# Patient Record
Sex: Female | Born: 1990 | Race: Black or African American | Hispanic: No | Marital: Single | Smoking: Never smoker
Health system: Southern US, Community
[De-identification: ages and names within clinical notes are randomized; demographics above are authoritative.]

## PROBLEM LIST (undated history)

## (undated) DIAGNOSIS — R51 Headache: Secondary | ICD-10-CM

## (undated) DIAGNOSIS — N6310 Unspecified lump in the right breast, unspecified quadrant: Secondary | ICD-10-CM

## (undated) DIAGNOSIS — R519 Headache, unspecified: Secondary | ICD-10-CM

## (undated) HISTORY — DX: Headache: R51

## (undated) HISTORY — DX: Headache, unspecified: R51.9

## (undated) HISTORY — DX: Unspecified lump in the right breast, unspecified quadrant: N63.10

---

## 2008-01-08 HISTORY — PX: WISDOM TOOTH EXTRACTION: SHX21

## 2010-12-12 ENCOUNTER — Emergency Department (HOSPITAL_COMMUNITY): Payer: Worker's Compensation

## 2010-12-12 ENCOUNTER — Emergency Department (HOSPITAL_COMMUNITY)
Admission: EM | Admit: 2010-12-12 | Discharge: 2010-12-12 | Disposition: A | Discharge status: Home / Self Care | Payer: Worker's Compensation | Attending: Emergency Medicine | Admitting: Emergency Medicine

## 2010-12-12 DIAGNOSIS — S5010XA Contusion of unspecified forearm, initial encounter: Secondary | ICD-10-CM

## 2010-12-12 DIAGNOSIS — R51 Headache: Secondary | ICD-10-CM | POA: Insufficient documentation

## 2010-12-12 DIAGNOSIS — M7989 Other specified soft tissue disorders: Secondary | ICD-10-CM | POA: Insufficient documentation

## 2010-12-12 DIAGNOSIS — M79609 Pain in unspecified limb: Secondary | ICD-10-CM | POA: Insufficient documentation

## 2010-12-12 DIAGNOSIS — Y990 Civilian activity done for income or pay: Secondary | ICD-10-CM | POA: Insufficient documentation

## 2010-12-12 DIAGNOSIS — IMO0002 Reserved for concepts with insufficient information to code with codable children: Secondary | ICD-10-CM | POA: Insufficient documentation

## 2010-12-12 DIAGNOSIS — R1012 Left upper quadrant pain: Secondary | ICD-10-CM | POA: Insufficient documentation

## 2010-12-12 DIAGNOSIS — S0990XA Unspecified injury of head, initial encounter: Secondary | ICD-10-CM | POA: Insufficient documentation

## 2010-12-12 DIAGNOSIS — M542 Cervicalgia: Secondary | ICD-10-CM | POA: Insufficient documentation

## 2010-12-12 DIAGNOSIS — S139XXA Sprain of joints and ligaments of unspecified parts of neck, initial encounter: Secondary | ICD-10-CM | POA: Insufficient documentation

## 2010-12-12 DIAGNOSIS — S161XXA Strain of muscle, fascia and tendon at neck level, initial encounter: Secondary | ICD-10-CM

## 2010-12-12 LAB — CBC
HCT: 31 % — ABNORMAL LOW (ref 36.0–46.0)
Hemoglobin: 10.4 g/dL — ABNORMAL LOW (ref 12.0–15.0)
MCH: 21.7 pg — ABNORMAL LOW (ref 26.0–34.0)
MCHC: 33.5 g/dL (ref 30.0–36.0)
RBC: 4.8 MIL/uL (ref 3.87–5.11)

## 2010-12-12 LAB — BASIC METABOLIC PANEL
BUN: 6 mg/dL (ref 6–23)
CO2: 24 mEq/L (ref 19–32)
GFR calc non Af Amer: >90 mL/min (ref >90)
Glucose, Bld: 97 mg/dL (ref 70–99)
Potassium: 3.3 mEq/L — ABNORMAL LOW (ref 3.5–5.1)
Sodium: 140 mEq/L (ref 135–145)

## 2010-12-12 MED ORDER — CYCLOBENZAPRINE HCL 10 MG PO TABS: 10.0000 mg | ORAL_TABLET | Freq: Two times a day (BID) | ORAL | Status: AC | PRN

## 2010-12-12 MED ORDER — TETANUS-DIPHTH-ACELL PERTUSSIS 5-2.5-18.5 LF-MCG/0.5 IM SUSP
0.5000 mL | Freq: Once | INTRAMUSCULAR | Status: AC
  Administered 2010-12-12: 0.5 mL via INTRAMUSCULAR
  Filled 2010-12-12: qty 0.5

## 2010-12-12 MED ORDER — SODIUM CHLORIDE 0.9 % IV SOLN: INTRAVENOUS | Status: DC

## 2010-12-12 MED ORDER — IOHEXOL 300 MG/ML  SOLN
80.0000 mL | Freq: Once | INTRAMUSCULAR | Status: AC | PRN
  Administered 2010-12-12: 80 mL via INTRAVENOUS

## 2010-12-12 MED ORDER — IBUPROFEN 800 MG PO TABS: 800.0000 mg | ORAL_TABLET | Freq: Three times a day (TID) | ORAL | Status: AC

## 2010-12-12 MED ORDER — SODIUM CHLORIDE 0.9 % IV BOLUS (SEPSIS): 250.0000 mL | Freq: Once | INTRAVENOUS | Status: DC

## 2010-12-12 MED ORDER — OXYCODONE-ACETAMINOPHEN 5-325 MG PO TABS: 1.0000 | ORAL_TABLET | Freq: Four times a day (QID) | ORAL | Status: AC | PRN
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
## 2010-12-12 NOTE — ED Provider Notes (Signed)
Pt scans all negative for acute process or fracture. Discussed pain management with patient and gave her a referral to Ortho in case she continue to have symptoms and needs further management. Denied the need for crutches or sling. Denied any other new pains. Pt safe to D/C at this time  Dorthula Matas, [place] 12/12/10 2201

## 2010-12-12 NOTE — ED Provider Notes (Signed)
Medical screening examination/treatment/procedure(s) were performed by non-physician practitioner and as supervising physician I was immediately available for consultation/collaboration.   Joya Gaskins, MD 12/12/10 2322

## 2011-02-20 ENCOUNTER — Ambulatory Visit: Payer: BC Managed Care – PPO | Admitting: Family Medicine

## 2011-02-20 ENCOUNTER — Encounter: Payer: Self-pay | Admitting: Family Medicine

## 2011-02-20 VITALS — BP 120/79 | HR 89 | Temp 98.7°F | Resp 16 | Ht 62.5 in | Wt 113.4 lb

## 2011-02-20 DIAGNOSIS — J069 Acute upper respiratory infection, unspecified: Secondary | ICD-10-CM

## 2011-02-20 MED ORDER — AMOXICILLIN-POT CLAVULANATE 875-125 MG PO TABS: 1.0000 | ORAL_TABLET | Freq: Two times a day (BID) | ORAL | Status: AC

## 2011-02-20 NOTE — Progress Notes (Signed)
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 She comes in with a six-week history of cough and congestion. She also has a swollen gland on the right has been there since November. Final she had some questions about STD testing  The cough is intermittent and productive of green mucus. She's had no known fever or weight loss. She has some fullness in her ears appear her throat has been slightly sore to0.  A swollen gland in her right neck has fluctuated in size it is slightly tender. She's noticed no other swollen areas or swollen glands.  She's had no intercourse but has had some oral sex.  Objective: Throat is mildly erythematous, TMs show mild SOM, chest is slightly congested with rhonchi. Heart is regular no murmur  Neck shows a half centimeter right submandibular node which is nontender and nonfluctuant. Presently neck exam is normal  Assessment: Persistent URI symptoms with benign appearing lymph node. Patient does not seem to be a significant risk for STD at this time.  Plan: Augmentin 875 twice a day x10 days  Recheck left gland if it remains swollen for another month

## 2011-04-15 ENCOUNTER — Ambulatory Visit (INDEPENDENT_AMBULATORY_CARE_PROVIDER_SITE_OTHER): Payer: BC Managed Care – PPO | Admitting: General Surgery

## 2011-04-15 ENCOUNTER — Encounter (INDEPENDENT_AMBULATORY_CARE_PROVIDER_SITE_OTHER): Payer: Self-pay | Admitting: General Surgery

## 2011-04-15 VITALS — BP 118/76 | HR 68 | Temp 97.6°F | Resp 16 | Ht 62.0 in | Wt 118.1 lb

## 2011-04-15 DIAGNOSIS — N63 Unspecified lump in unspecified breast: Secondary | ICD-10-CM | POA: Insufficient documentation

## 2011-04-15 NOTE — Progress Notes (Signed)
Patient ID: Barbara Hendricks, female   DOB: 1990-06-03, 21 y.o.   MRN: [id number]  Chief Complaint  Patient presents with  . Mass    Right breast     HPI Barbara Hendricks is a 21 y.o. female.  Referred by Dr. Liz Beach HPI 20yof otherwise healthy who has had a right breast mass since at least 2011.  She underwent u/s then that I have report for which showed a superficial mass that measured 3.3x2.6x2.45mm in size. She was going to follow up in six months for another but did not.  To her, this area has gotten a little bigger but otherwise is asymptomatic.  She denies discharge.  Denies other masses. No FH of breast cancer. She comes in today to discuss treatment options.  It also has never had a history of infection.  Past Medical History  Diagnosis Date  . Generalized headaches   . Breast mass, right     Past Surgical History  Procedure Date  . Wisdom tooth extraction 01/2008    Family History  Problem Relation Age of Onset  . Cancer Maternal Grandmother     colon  . Cancer Paternal Grandmother     coloni    Social History History  Substance Use Topics  . Smoking status: Never Smoker   . Smokeless tobacco: Never Used  . Alcohol Use: Yes     1 - 2 per month    Allergies  Allergen Reactions  . Food Other (See Comments)    Strawberries=hives     Current Outpatient Prescriptions  Medication Sig Dispense Refill  . OVER THE COUNTER MEDICATION Take 1 tablet by mouth daily. Over the counter sinus and flu medication.  Patient took 1 tablet last night       . ranitidine (ZANTAC) 75 MG tablet Take 75 mg by mouth 2 (two) times daily.          Review of Systems Review of Systems  Constitutional: Negative for fever, chills and unexpected weight change.  HENT: Negative for hearing loss, congestion, sore throat, trouble swallowing and voice change.   Eyes: Negative for visual disturbance.  Respiratory: Negative for cough and wheezing.   Cardiovascular: Negative for chest  pain, palpitations and leg swelling.  Gastrointestinal: Negative for nausea, vomiting, abdominal pain, diarrhea, constipation, blood in stool, abdominal distention and anal bleeding.  Genitourinary: Negative for hematuria, vaginal bleeding and difficulty urinating.  Musculoskeletal: Negative for arthralgias.  Skin: Negative for rash and wound.  Neurological: Positive for headaches. Negative for seizures and syncope.  Hematological: Negative for adenopathy. Does not bruise/bleed easily.  Psychiatric/Behavioral: Negative for confusion.    Blood pressure 118/76, pulse 68, temperature 97.6 F (36.4 C), temperature source Temporal, resp. rate 16, height 5\' 2"  (1.575 m), weight 118 lb 2 oz (53.581 kg).  Physical Exam Physical Exam  Vitals reviewed. Constitutional: She appears well-developed and well-nourished.  Pulmonary/Chest: Right breast exhibits mass. Right breast exhibits no inverted nipple, no nipple discharge, no skin change and no tenderness. Left breast exhibits no inverted nipple, no mass, no nipple discharge, no skin change and no tenderness. Breasts are symmetrical.    Lymphadenopathy:    She has no axillary adenopathy.    Data Reviewed Prior u/s report from Dec 2011  Assessment    Right breast mass    Plan    This appears to be a breast mass and not skin lesion.  We discussed options including repeat ultrasound for follow up vs excision.  We both  agreed on obtaining another u/s and then following up to discuss the next step.        Barbara Hendricks 04/15/2011, 8:45 PM

## 2011-04-25 ENCOUNTER — Ambulatory Visit
Admission: RE | Admit: 2011-04-25 | Discharge: 2011-04-25 | Disposition: A | Discharge status: Home / Self Care | Payer: BC Managed Care – PPO | Source: Ambulatory Visit | Attending: General Surgery | Admitting: General Surgery

## 2011-04-25 DIAGNOSIS — N63 Unspecified lump in unspecified breast: Secondary | ICD-10-CM

## 2011-04-26 ENCOUNTER — Other Ambulatory Visit: Payer: Self-pay

## 2011-05-13 ENCOUNTER — Encounter (INDEPENDENT_AMBULATORY_CARE_PROVIDER_SITE_OTHER): Payer: BC Managed Care – PPO | Admitting: General Surgery

## 2011-05-13 ENCOUNTER — Ambulatory Visit: Payer: BC Managed Care – PPO | Admitting: Internal Medicine

## 2011-05-13 VITALS — BP 112/74 | HR 94 | Temp 99.1°F | Resp 16 | Ht 62.5 in | Wt 115.0 lb

## 2011-05-13 DIAGNOSIS — J020 Streptococcal pharyngitis: Secondary | ICD-10-CM

## 2011-05-13 DIAGNOSIS — J309 Allergic rhinitis, unspecified: Secondary | ICD-10-CM

## 2011-05-13 DIAGNOSIS — J039 Acute tonsillitis, unspecified: Secondary | ICD-10-CM

## 2011-05-13 DIAGNOSIS — I889 Nonspecific lymphadenitis, unspecified: Secondary | ICD-10-CM

## 2011-05-13 DIAGNOSIS — R599 Enlarged lymph nodes, unspecified: Secondary | ICD-10-CM

## 2011-05-13 DIAGNOSIS — J029 Acute pharyngitis, unspecified: Secondary | ICD-10-CM

## 2011-05-13 MED ORDER — AMOXICILLIN 500 MG PO CAPS: 1000.0000 mg | ORAL_CAPSULE | Freq: Two times a day (BID) | ORAL | Status: AC

## 2011-05-13 MED ORDER — FLUTICASONE PROPIONATE 50 MCG/ACT NA SUSP: 2.0000 | Freq: Every day | NASAL | Status: AC

## 2011-05-13 NOTE — Progress Notes (Signed)
  Subjective:    Patient ID: Barbara Hendricks, female    DOB: 06/27/1990, 21 y.o.   MRN: [id number]  HPIComplaining of sore throat for 3 days She is also worried about a swollen right anterior cervical node which has been present since November 2011/see old chart No fever or chills No cough No runny nose    Review of Systems     Objective:   Physical Exam Nares clear Throat red with enlarged tonsils and slight exudate Right a.c. Node is 2+ and tender high in the a.c. Chain No submandibular node Teeth are clear       Results for orders placed in visit on 05/13/11  POCT RAPID STREP A (OFFICE)      Component Value Range   Rapid Strep A Screen Positive (*) Negative     Assessment & Plan:  Problem #1 acute tonsillitis secondary to strep Amoxicillin 500 2 tabs twice a day for 10 days Recheck lymph nodes in 3 weeks

## 2011-05-21 ENCOUNTER — Encounter (INDEPENDENT_AMBULATORY_CARE_PROVIDER_SITE_OTHER): Payer: Self-pay

## 2011-10-15 ENCOUNTER — Encounter (INDEPENDENT_AMBULATORY_CARE_PROVIDER_SITE_OTHER): Payer: Self-pay | Admitting: General Surgery

## 2011-12-20 ENCOUNTER — Encounter: Payer: Self-pay | Admitting: Family Medicine

## 2011-12-20 ENCOUNTER — Ambulatory Visit: Payer: BC Managed Care – PPO

## 2011-12-20 ENCOUNTER — Ambulatory Visit: Admission: RE | Admit: 2011-12-20 | Payer: Self-pay | Source: Ambulatory Visit

## 2011-12-20 ENCOUNTER — Ambulatory Visit (INDEPENDENT_AMBULATORY_CARE_PROVIDER_SITE_OTHER): Payer: BC Managed Care – PPO | Admitting: Family Medicine

## 2011-12-20 VITALS — BP 120/66 | HR 78 | Temp 99.4°F | Resp 16 | Ht 62.0 in | Wt 115.0 lb

## 2011-12-20 DIAGNOSIS — R5383 Other fatigue: Secondary | ICD-10-CM

## 2011-12-20 DIAGNOSIS — R5381 Other malaise: Secondary | ICD-10-CM

## 2011-12-20 DIAGNOSIS — M79671 Pain in right foot: Secondary | ICD-10-CM

## 2011-12-20 DIAGNOSIS — M791 Myalgia, unspecified site: Secondary | ICD-10-CM

## 2011-12-20 DIAGNOSIS — M79609 Pain in unspecified limb: Secondary | ICD-10-CM

## 2011-12-20 DIAGNOSIS — IMO0001 Reserved for inherently not codable concepts without codable children: Secondary | ICD-10-CM

## 2011-12-20 LAB — CBC WITH DIFFERENTIAL/PLATELET
Eosinophils Absolute: 0 10*3/uL (ref 0.0–0.7)
Eosinophils Relative: 1 % (ref 0–5)
HCT: 32.6 % — ABNORMAL LOW (ref 36.0–46.0)
Lymphocytes Relative: 48 % — ABNORMAL HIGH (ref 12–46)
Lymphs Abs: 2.8 10*3/uL (ref 0.7–4.0)
MCH: 20.8 pg — ABNORMAL LOW (ref 26.0–34.0)
MCV: 64.4 fL — ABNORMAL LOW (ref 78.0–100.0)
Monocytes Absolute: 0.5 10*3/uL (ref 0.1–1.0)
Monocytes Relative: 8 % (ref 3–12)
Platelets: 325 10*3/uL (ref 150–400)
RBC: 5.06 MIL/uL (ref 3.87–5.11)
WBC: 5.8 10*3/uL (ref 4.0–10.5)

## 2011-12-20 LAB — COMPREHENSIVE METABOLIC PANEL
AST: 19 U/L (ref 0–37)
Albumin: 4.4 g/dL (ref 3.5–5.2)
BUN: 8 mg/dL (ref 6–23)
CO2: 23 mEq/L (ref 19–32)
Calcium: 9.3 mg/dL (ref 8.4–10.5)
Chloride: 105 mEq/L (ref 96–112)
Creat: 0.56 mg/dL (ref 0.50–1.10)
Glucose, Bld: 87 mg/dL (ref 70–99)
Potassium: 3.9 mEq/L (ref 3.5–5.3)

## 2011-12-20 LAB — CK: Total CK: 79 U/L (ref 7–177)

## 2011-12-20 MED ORDER — NAPROXEN 500 MG PO TABS: 500.0000 mg | ORAL_TABLET | Freq: Two times a day (BID) | ORAL | Status: DC

## 2011-12-20 MED ORDER — METHOCARBAMOL 500 MG PO TABS: 500.0000 mg | ORAL_TABLET | Freq: Four times a day (QID) | ORAL | Status: DC | PRN

## 2011-12-20 NOTE — Progress Notes (Signed)
  Subjective:    Patient ID: Barbara Hendricks, female    DOB: 1990-04-12, 21 y.o.   MRN: [id number] Chief Complaint  Patient presents with  . Follow-up    right foot hurts x 3wks and back hurts x 1 wk    HPI 3 or 4 yrs ago a bed fell on her right foot at [place] w/ severe bruising Sev d ago had sharp pain lasting for a few sec and now get a few twinges w/ certain movements    Review of Systems    BP 120/66  Pulse 78  Temp(Src) 99.4 F (37.4 C) (Oral)  Resp 16  Ht 5\' 2"  (1.575 m)  Wt 115 lb (52.164 kg)  BMI 21.03 kg/m2  SpO2 100%  LMP 11/29/2011 Objective:   Physical Exam        [facility] reading (PRIMARY) by  Dr. Clelia Croft.  No bony abnormality seen.   Assessment & Plan:  Foot pain, right - Plan: naproxen (NAPROSYN) 500 MG tablet, DG Foot Complete Right, CANCELED: DG Foot Complete Right  Myalgia - Plan: Vitamin D 25 hydroxy, CK, Sedimentation rate, methocarbamol (ROBAXIN) 500 MG tablet  Fatigue - Plan: CBC with Differential, TSH, Comprehensive metabolic panel  Meds ordered this encounter  Medications  . methocarbamol (ROBAXIN) 500 MG tablet    Sig: Take 1 tablet (500 mg total) by mouth 4 (four) times daily as needed (muscle spasm).    Dispense:  60 tablet    Refill:  0  . naproxen (NAPROSYN) 500 MG tablet    Sig: Take 1 tablet (500 mg total) by mouth 2 (two) times daily with a meal.    Dispense:  30 tablet    Refill:  2

## 2011-12-21 LAB — SEDIMENTATION RATE: Sed Rate: 18 mm/hr (ref 0–22)

## 2011-12-23 ENCOUNTER — Ambulatory Visit: Payer: BC Managed Care – PPO

## 2011-12-26 ENCOUNTER — Telehealth: Payer: Self-pay | Admitting: Radiology

## 2011-12-26 NOTE — Telephone Encounter (Signed)
Dr. Clelia Croft, FYI: [facility] of normal labs.

## 2011-12-26 NOTE — Telephone Encounter (Signed)
Pt called to get results, results are final but do need review. Please review and we will contact pt. I also explained to pt that we would give her call back once PA has reviewed her labs. She understood.

## 2011-12-26 NOTE — Telephone Encounter (Signed)
Labs normal.

## 2012-02-22 ENCOUNTER — Other Ambulatory Visit: Payer: Self-pay

## 2012-03-11 ENCOUNTER — Other Ambulatory Visit (INDEPENDENT_AMBULATORY_CARE_PROVIDER_SITE_OTHER): Payer: Self-pay | Admitting: General Surgery

## 2012-03-11 DIAGNOSIS — N6310 Unspecified lump in the right breast, unspecified quadrant: Secondary | ICD-10-CM

## 2012-03-16 ENCOUNTER — Ambulatory Visit
Admission: RE | Admit: 2012-03-16 | Discharge: 2012-03-16 | Disposition: A | Discharge status: Home / Self Care | Payer: BC Managed Care – PPO | Source: Ambulatory Visit | Attending: General Surgery | Admitting: General Surgery
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
## 2012-11-12 ENCOUNTER — Other Ambulatory Visit: Payer: Self-pay

## 2013-01-12 ENCOUNTER — Ambulatory Visit: Payer: BC Managed Care – PPO

## 2013-02-12 ENCOUNTER — Ambulatory Visit (INDEPENDENT_AMBULATORY_CARE_PROVIDER_SITE_OTHER): Payer: BC Managed Care – PPO | Admitting: Family Medicine

## 2013-02-12 ENCOUNTER — Encounter: Payer: Self-pay | Admitting: Family Medicine

## 2013-02-12 VITALS — BP 120/70 | HR 85 | Temp 98.3°F | Resp 16 | Ht 62.0 in | Wt 122.0 lb

## 2013-02-12 DIAGNOSIS — K625 Hemorrhage of anus and rectum: Secondary | ICD-10-CM

## 2013-02-12 DIAGNOSIS — R109 Unspecified abdominal pain: Secondary | ICD-10-CM

## 2013-02-12 LAB — CBC WITH DIFFERENTIAL/PLATELET
BASOS PCT: 1 % (ref 0–1)
Basophils Absolute: 0 10*3/uL (ref 0.0–0.1)
EOS ABS: 0 10*3/uL (ref 0.0–0.7)
Eosinophils Relative: 0 % (ref 0–5)
HEMATOCRIT: 33.3 % — AB (ref 36.0–46.0)
HEMOGLOBIN: 10.9 g/dL — AB (ref 12.0–15.0)
LYMPHS ABS: 3.2 10*3/uL (ref 0.7–4.0)
Lymphocytes Relative: 44 % (ref 12–46)
MCH: 21.3 pg — AB (ref 26.0–34.0)
MCHC: 32.7 g/dL (ref 30.0–36.0)
MCV: 65 fL — ABNORMAL LOW (ref 78.0–100.0)
MONO ABS: 0.6 10*3/uL (ref 0.1–1.0)
MONOS PCT: 8 % (ref 3–12)
NEUTROS ABS: 3.5 10*3/uL (ref 1.7–7.7)
Neutrophils Relative %: 47 % (ref 43–77)
Platelets: 331 10*3/uL (ref 150–400)
RBC: 5.12 MIL/uL — ABNORMAL HIGH (ref 3.87–5.11)
RDW: 20.1 % — ABNORMAL HIGH (ref 11.5–15.5)
WBC: 7.4 10*3/uL (ref 4.0–10.5)

## 2013-02-12 MED ORDER — OMEPRAZOLE 20 MG PO CPDR: 20.0000 mg | DELAYED_RELEASE_CAPSULE | Freq: Every day | ORAL | Status: DC

## 2013-02-12 NOTE — Progress Notes (Signed)
S:   This 23 y.o. AA female is here for evaluation of chronic (> 6 months) abdominal bloating and discomfort. She tried Acidophilus for a few weeks and loose stools seem to worsen. Onset of swollen hemorrhoids and blood in stools started a few weeks ago. This continued even after she stopped taking the Acidophilus. She does not consume a lot of dairy. Consumption of salad products seems to aggravate symptoms. Fiber intake less than adequate. Pt has not used any over-the-counter medications for these symptoms.  Family hx significant for Colon cancer in both grandmothers. Her father has hx of hemorrhoids requiring surgery.  Patient Active Problem List   Diagnosis Date Noted  . Breast mass in female 04/15/2011   Prior to Admission medications   Medication Sig Start Date End Date Taking? Authorizing Provider  cetirizine (ZYRTEC) 10 MG tablet Take 10 mg by mouth daily.    Historical Provider, MD  fluticasone (FLONASE) 50 MCG/ACT nasal spray Place 2 sprays into the nose daily. 05/13/11 05/12/12  Tonye Pearsonobert P Doolittle, MD  omeprazole (PRILOSEC) 20 MG capsule Take 1 capsule (20 mg total) by mouth daily. 02/12/13   Maurice MarchBarbara B Shakeela Rabadan, MD   PMHx, Surg Hx, Soc and Fam Hx reviewed.  ROS; Asper HPI; negative for anorexia, fever/chills, abnormal weight loss, fatigue, hair or skin changes, changes in menses or abnormal bleeding, pelvic pain, HA, dizziness, weakness or syncope.  O: Filed Vitals:   02/12/13 1337  BP: 120/70  Pulse: 85  Temp: 98.3 F (36.8 C)  Resp: 16   GEN: in NAD: WN,WD. HENT: [place]/AT; EOMI w/ clear conj/sclerae. EACs/TMs, nose and oroph benign NECK: Supple w/o LAN or TMG.  COR: RRR. Normal S1 and S2. No mg/r. SKIN: W&D; Intact w/o diaphoresis, erythema or jaundice. ABD: Normal appearance; soft w/o guarding or distention. Mildly tender in epig and lower quadrants. No rebound. No HSM or masses. RECTAL: Ext hemorrhoids (mild) and small anal fissure. No active bleeding. NEURO: A&O x 3; Cns  intact. Nonfocal.  A/P: Rectal bleeding - Plan: CBC with Differential, COMPLETE METABOLIC PANEL WITH GFR, H. pylori antibody, IgG  Abdominal pain, unspecified site - Trial PPI (Omeprazole 20 mg daily) pending labs.  Plan: CBC with Differential, COMPLETE METABOLIC PANEL WITH GFR, H. pylori antibody, IgG   Meds ordered this encounter  Medications  . omeprazole (PRILOSEC) 20 MG capsule    Sig: Take 1 capsule (20 mg total) by mouth daily.    Dispense:  30 capsule    Refill:  5

## 2013-02-12 NOTE — Patient Instructions (Addendum)
Helicobacter Pylori Antibodies Test This is a blood test which looks for a germ called Helicobacter pylori. This can also be diagnosed by a breath test or a microscopic examination of a portion (biopsy) of the small bowel. H. pylori is a germ that is found in the cells that line the stomach. It is a risk factor for stomach and small bowel ulcers, long standing inflammation of the lining of the stomach, or even ulcers that may occur in the esophagus (the canal that runs from the mouth to the stomach). This bacterium is also a factor in stomach cancer. The amount of the bacteria is found in about 10% of healthy persons younger than 25 years of age and the amount of the bacteria increases with age. Most persons with these bacteria have no symptoms; however, it is thought that when these bacteria cause ulcers, antibiotic medications can be used to help eliminate or reduce the problem.  PREPARATION FOR TEST No preparation or fasting is necessary. NORMAL FINDINGS Negative (H-pylori bacteria not present) Ranges for normal findingsmay vary among different laboratories and hospitals. You should always check with your doctor after having lab work or other tests done to discuss the meaning of your test results and whether or not your values are considered within normal limits. MEANING OF TEST  Your caregiver will go over the test results with you and discuss the importance and meaning of your results, as well as treatment options and the need for additional tests if necessary. It is your responsibility to obtain your test results. Ask the lab or department performing the test when and how you will get your results. OBTAINING THE TEST RESULTS It is your responsibility to obtain your test results. Ask the lab or department performing the test when and how you will get your results. Document Released: 01/18/2004 Document Revised: 03/18/2011 Document Reviewed: 12/05/2007 [facility] [place] Patient Information 2014 [place],  [place].    Bloating Bloating is the feeling of fullness in your belly. You may feel as though your pants are too tight. Often the cause of bloating is overeating, retaining fluids, or having gas in your bowel. It is also caused by swallowing air and eating foods that cause gas. Irritable bowel syndrome is one of the most common causes of bloating. Constipation is also a common cause. Sometimes more serious problems can cause bloating. SYMPTOMS  Usually there is a feeling of fullness, as though your abdomen is bulged out. There may be mild discomfort.  DIAGNOSIS  Usually no particular testing is necessary for most bloating. If the condition persists and seems to become worse, your caregiver may do additional testing.  TREATMENT   There is no direct treatment for bloating.  Do not put gas into the bowel. Avoid chewing gum and sucking on candy. These tend to make you swallow air. Swallowing air can also be a nervous habit. Try to avoid this.  Avoiding high residue diets will help. Eat foods with soluble fibers (examples include root vegetables, apples, or barley) and substitute dairy products with soy and rice products. This helps irritable bowel syndrome.  If constipation is the cause, then a high residue diet with more fiber will help.  Avoid carbonated beverages.  Over-the-counter preparations are available that help reduce gas. Your pharmacist can help you with this. SEEK MEDICAL CARE IF:   Bloating continues and seems to be getting worse.  You notice a weight gain.  You have a weight loss but the bloating is getting worse.  You have changes  in your bowel habits or develop nausea or vomiting. SEEK IMMEDIATE MEDICAL CARE IF:   You develop shortness of breath or swelling in your legs.  You have an increase in abdominal pain or develop chest pain. Document Released: 10/24/2005 Document Revised: 03/18/2011 Document Reviewed: 12/12/2006 [facility] Patient Information 2014 [place],  [place].  I am going to prescribe some medication for you to take until we have the lab results back.  Take the medication every morning on an empty stomach. Try to eat as healthy as possible.  I will see you again in 6 weeks or sooner if your symptom

## 2013-02-13 LAB — COMPLETE METABOLIC PANEL WITH GFR
ALBUMIN: 4.4 g/dL (ref 3.5–5.2)
ALK PHOS: 61 U/L (ref 39–117)
ALT: 9 U/L (ref 0–35)
AST: 18 U/L (ref 0–37)
BUN: 7 mg/dL (ref 6–23)
CALCIUM: 9.7 mg/dL (ref 8.4–10.5)
CO2: 22 meq/L (ref 19–32)
Chloride: 103 mEq/L (ref 96–112)
Creat: 0.65 mg/dL (ref 0.50–1.10)
GLUCOSE: 86 mg/dL (ref 70–99)
POTASSIUM: 3.7 meq/L (ref 3.5–5.3)
Sodium: 139 mEq/L (ref 135–145)
Total Bilirubin: 0.5 mg/dL (ref 0.2–1.2)
Total Protein: 8 g/dL (ref 6.0–8.3)

## 2013-02-14 DIAGNOSIS — K649 Unspecified hemorrhoids: Secondary | ICD-10-CM | POA: Insufficient documentation

## 2013-02-15 LAB — H. PYLORI ANTIBODY, IGG: H PYLORI IGG: 0.89 {ISR}

## 2013-02-16 ENCOUNTER — Other Ambulatory Visit: Payer: Self-pay | Admitting: Family Medicine

## 2013-02-16 ENCOUNTER — Encounter: Payer: Self-pay | Admitting: Family Medicine

## 2013-02-16 MED ORDER — CENTRUM PO CHEW: 1.0000 | CHEWABLE_TABLET | Freq: Every day | ORAL | Status: AC

## 2013-02-18 NOTE — Telephone Encounter (Signed)
Spoke with patient gave patient message and advised her to follow back up in 3 months patient understands and will set up an appt

## 2013-03-09 ENCOUNTER — Telehealth: Payer: Self-pay

## 2013-03-09 NOTE — Telephone Encounter (Signed)
Pt states that the medication that was given to her was "making the problem worse" so she stopped taking it. She would like to know what to do from here.  Please call  [phone]

## 2013-03-11 NOTE — Telephone Encounter (Signed)
Pt needs to rtc for further evaluation. Lm for rtn call or rtc.

## 2013-03-18 ENCOUNTER — Encounter: Payer: Self-pay | Admitting: Family Medicine

## 2013-03-18 ENCOUNTER — Ambulatory Visit (INDEPENDENT_AMBULATORY_CARE_PROVIDER_SITE_OTHER): Payer: BC Managed Care – PPO | Admitting: Family Medicine

## 2013-03-18 VITALS — BP 122/70 | HR 72 | Temp 98.5°F | Resp 16 | Ht 62.5 in | Wt 122.0 lb

## 2013-03-18 DIAGNOSIS — K625 Hemorrhage of anus and rectum: Secondary | ICD-10-CM

## 2013-03-18 DIAGNOSIS — D649 Anemia, unspecified: Secondary | ICD-10-CM

## 2013-03-18 DIAGNOSIS — K649 Unspecified hemorrhoids: Secondary | ICD-10-CM

## 2013-03-18 MED ORDER — HYDROCORTISONE ACETATE 25 MG RE SUPP: 25.0000 mg | Freq: Two times a day (BID) | RECTAL | Status: AC

## 2013-03-18 NOTE — Patient Instructions (Addendum)
I have referred you to a Gastroenterologist (GI specialist) for further evaluation of hemorrhoids and change in stools as well. You can use the prescribed medication for hemorrhoids until you are the specialist.   Hemorrhoids Hemorrhoids are swollen veins around the rectum or anus. There are two types of hemorrhoids:   Internal hemorrhoids. These occur in the veins just inside the rectum. They may poke through to the outside and become irritated and painful.  External hemorrhoids. These occur in the veins outside the anus and can be felt as a painful swelling or hard lump near the anus. CAUSES  Pregnancy.   Obesity.   Constipation or diarrhea.   Straining to have a bowel movement.   Sitting for long periods on the toilet.  Heavy lifting or other activity that caused you to strain.  Anal intercourse. SYMPTOMS   Pain.   Anal itching or irritation.   Rectal bleeding.   Fecal leakage.   Anal swelling.   One or more lumps around the anus.  DIAGNOSIS  Your caregiver may be able to diagnose hemorrhoids by visual examination. Other examinations or tests that may be performed include:   Examination of the rectal area with a gloved hand (digital rectal exam).   Examination of anal canal using a small tube (scope).   A blood test if you have lost a significant amount of blood.  A test to look inside the colon (sigmoidoscopy or colonoscopy). TREATMENT Most hemorrhoids can be treated at home. However, if symptoms do not seem to be getting better or if you have a lot of rectal bleeding, your caregiver may perform a procedure to help make the hemorrhoids get smaller or remove them completely. Possible treatments include:   Placing a rubber band at the base of the hemorrhoid to cut off the circulation (rubber band ligation).   Injecting a chemical to shrink the hemorrhoid (sclerotherapy).   Using a tool to burn the hemorrhoid (infrared light therapy).    Surgically removing the hemorrhoid (hemorrhoidectomy).   Stapling the hemorrhoid to block blood flow to the tissue (hemorrhoid stapling).  HOME CARE INSTRUCTIONS   Eat foods with fiber, such as whole grains, beans, nuts, fruits, and vegetables. Ask your doctor about taking products with added fiber in them (fibersupplements).  Increase fluid intake. Drink enough water and fluids to keep your urine clear or pale yellow.   Exercise regularly.   Go to the bathroom when you have the urge to have a bowel movement. Do not wait.   Avoid straining to have bowel movements.   Keep the anal area dry and clean. Use wet toilet paper or moist towelettes after a bowel movement.   Medicated creams and suppositories may be used or applied as directed.   Only take over-the-counter or prescription medicines as directed by your caregiver.   Take warm sitz baths for 15 20 minutes, 3 4 times a day to ease pain and discomfort.   Place ice packs on the hemorrhoids if they are tender and swollen. Using ice packs between sitz baths may be helpful.   Put ice in a plastic bag.   Place a towel between your skin and the bag.   Leave the ice on for 15 20 minutes, 3 4 times a day.   Do not use a donut-shaped pillow or sit on the toilet for long periods. This increases blood pooling and pain.  SEEK MEDICAL CARE IF:  You have increasing pain and swelling that is not controlled by treatment  or medicine.  You have uncontrolled bleeding.  You have difficulty or you are unable to have a bowel movement.  You have pain or inflammation outside the area of the hemorrhoids. MAKE SURE YOU:  Understand these instructions.  Will watch your condition.  Will get help right away if you are not doing well or get worse. Document Released: 12/22/1999 Document Revised: 12/11/2011 Document Reviewed: 10/29/2011 [facility] Patient Information 2014 [place], [place].

## 2013-03-21 ENCOUNTER — Encounter: Payer: Self-pay | Admitting: Family Medicine

## 2013-03-21 DIAGNOSIS — D649 Anemia, unspecified: Secondary | ICD-10-CM | POA: Insufficient documentation

## 2013-03-21 NOTE — Progress Notes (Signed)
S:  This 23 y.o. AA female returns for follow-up re: hemorrhoids and intermittent blood in stools. Her appetite is good and weight is stable. She has mild irregularity of bowel movements. Nutritional modification has resulted in some improvement of symptoms. She denies n/v/d or abdominal or pelvic pain. PPI was prescribed at last visit which helped for a few weeks. Pt then experienced increased stool frequency which irritated hemorrhoids; pt discontinued Omeprazole and stool consistency returned to normal.  Pt has a chronic anemia dating back 2 years. She is asymptomatic, denying fatigue, CP or palpitations, edema, SOB or DOE, muscle cramps or intolerance of cold. She has not been taking prescribed MVI daily.  Patient Active Problem List   Diagnosis Date Noted  . Hemorrhoids 02/14/2013  . Breast mass in female 04/15/2011   Prior to Admission medications   Medication Sig Start Date End Date Taking? Authorizing Provider  multivitamin-iron-minerals-folic acid (CENTRUM) chewable tablet Chew 1 tablet by mouth daily. 02/16/13  Yes Maurice MarchBarbara B Ivann Trimarco, MD  fluticasone [facility](FLONASE) 50 MCG/ACT nasal spray Place 2 sprays into the nose daily. 05/13/11 05/12/12  Tonye Pearsonobert P Doolittle, MD          PMHx, Surg Hx, Soc and Fam Hx reviewed.  ROS: AS per HPI.  O: Filed Vitals:   03/18/13 1244  BP: 122/70  Pulse: 72  Temp: 98.5 F (36.9 C)  Resp: 16   GEN: in NAD: WN,WD. HENT: [place]/AT; EOMI w/ clear conj/sclerae. Mild conjunctival pallor. Otherwise unremarkable. NECK: Supple. No LAN. COR: RRR. No peripheral edema. LUNGS: Unlabored resp. SKIN: W&D; intact w/o  Diaphoresis, erythema, rashes or jaundice. MS: MAEs; no deformity, joint effusions or muscle tenderness. NEURO: A&O x 3; CNs intact. Nonfocal.  A/P: Hemorrhoids - Plan: Ambulatory referral to Gastroenterology. Cotinue dietary modifications to normalize stools.  Rectal bleeding - Plan: Ambulatory referral to Gastroenterology  Anemia - Continue MVI and  healthy nutrition.  Meds ordered this encounter  Medications  . hydrocortisone (ANUSOL-HC) 25 MG suppository    Sig: Place 1 suppository (25 mg total) rectally 2 (two) times daily. Use as needed for hemorrhoid inflammation.    Dispense:  12 suppository    Refill:  0

## 2013-10-27 IMAGING — CR DG FOOT COMPLETE 3+V*R*
3 series · 3 of 3 positions shown · non-contrast
Comparison: None.

CLINICAL DATA: Injury

RIGHT FOOT COMPLETE - 3+ VIEW

[AP]
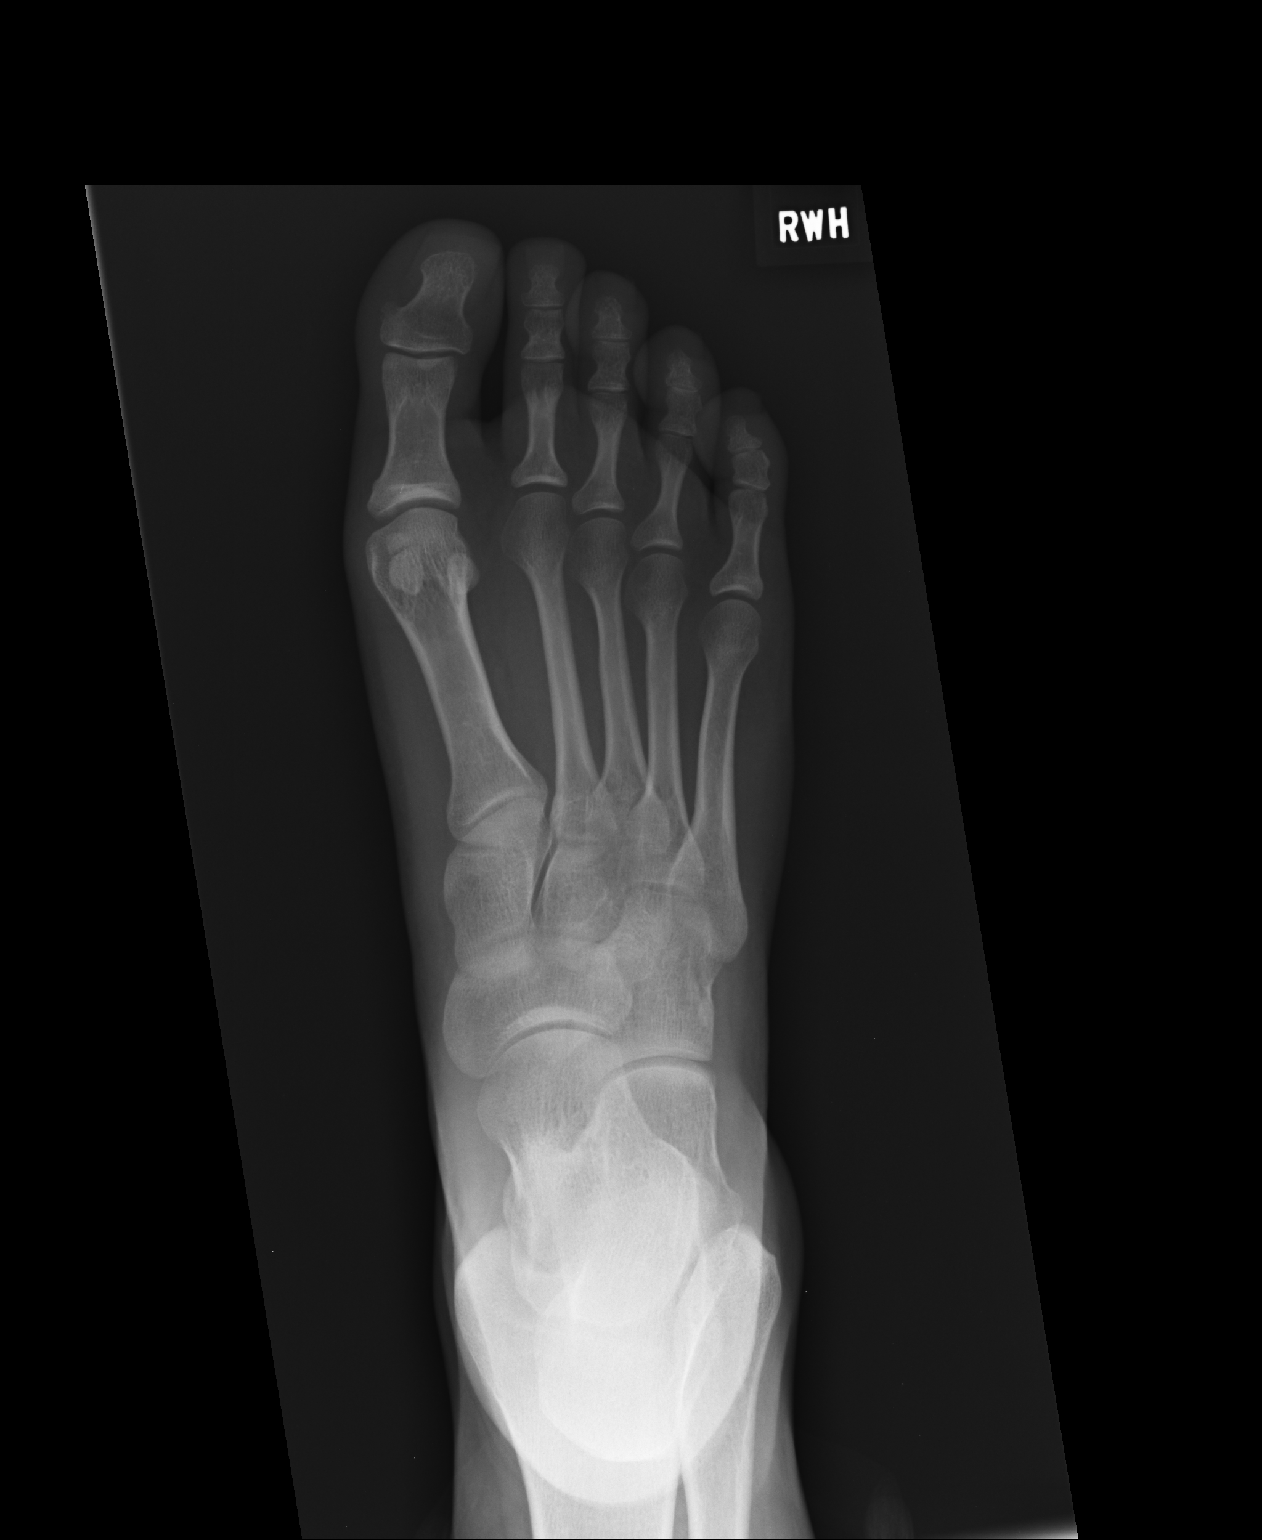

[lateral]
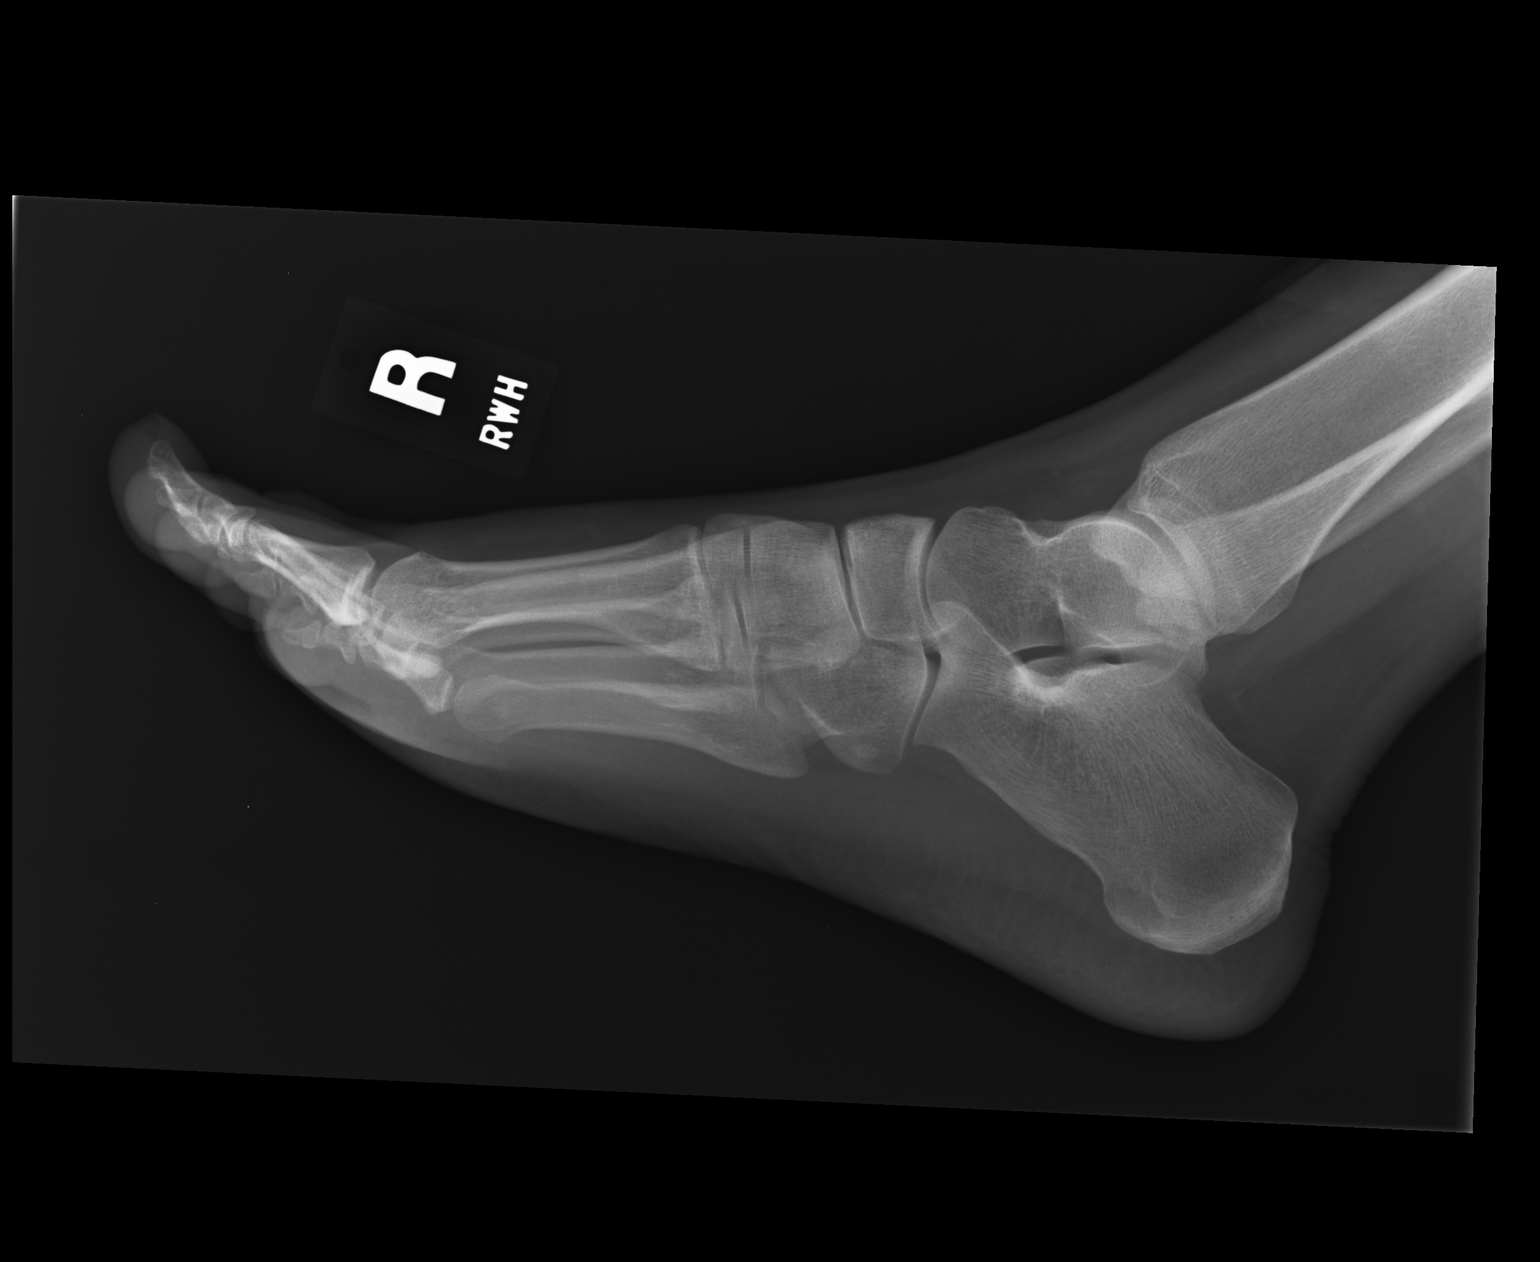

[oblique]
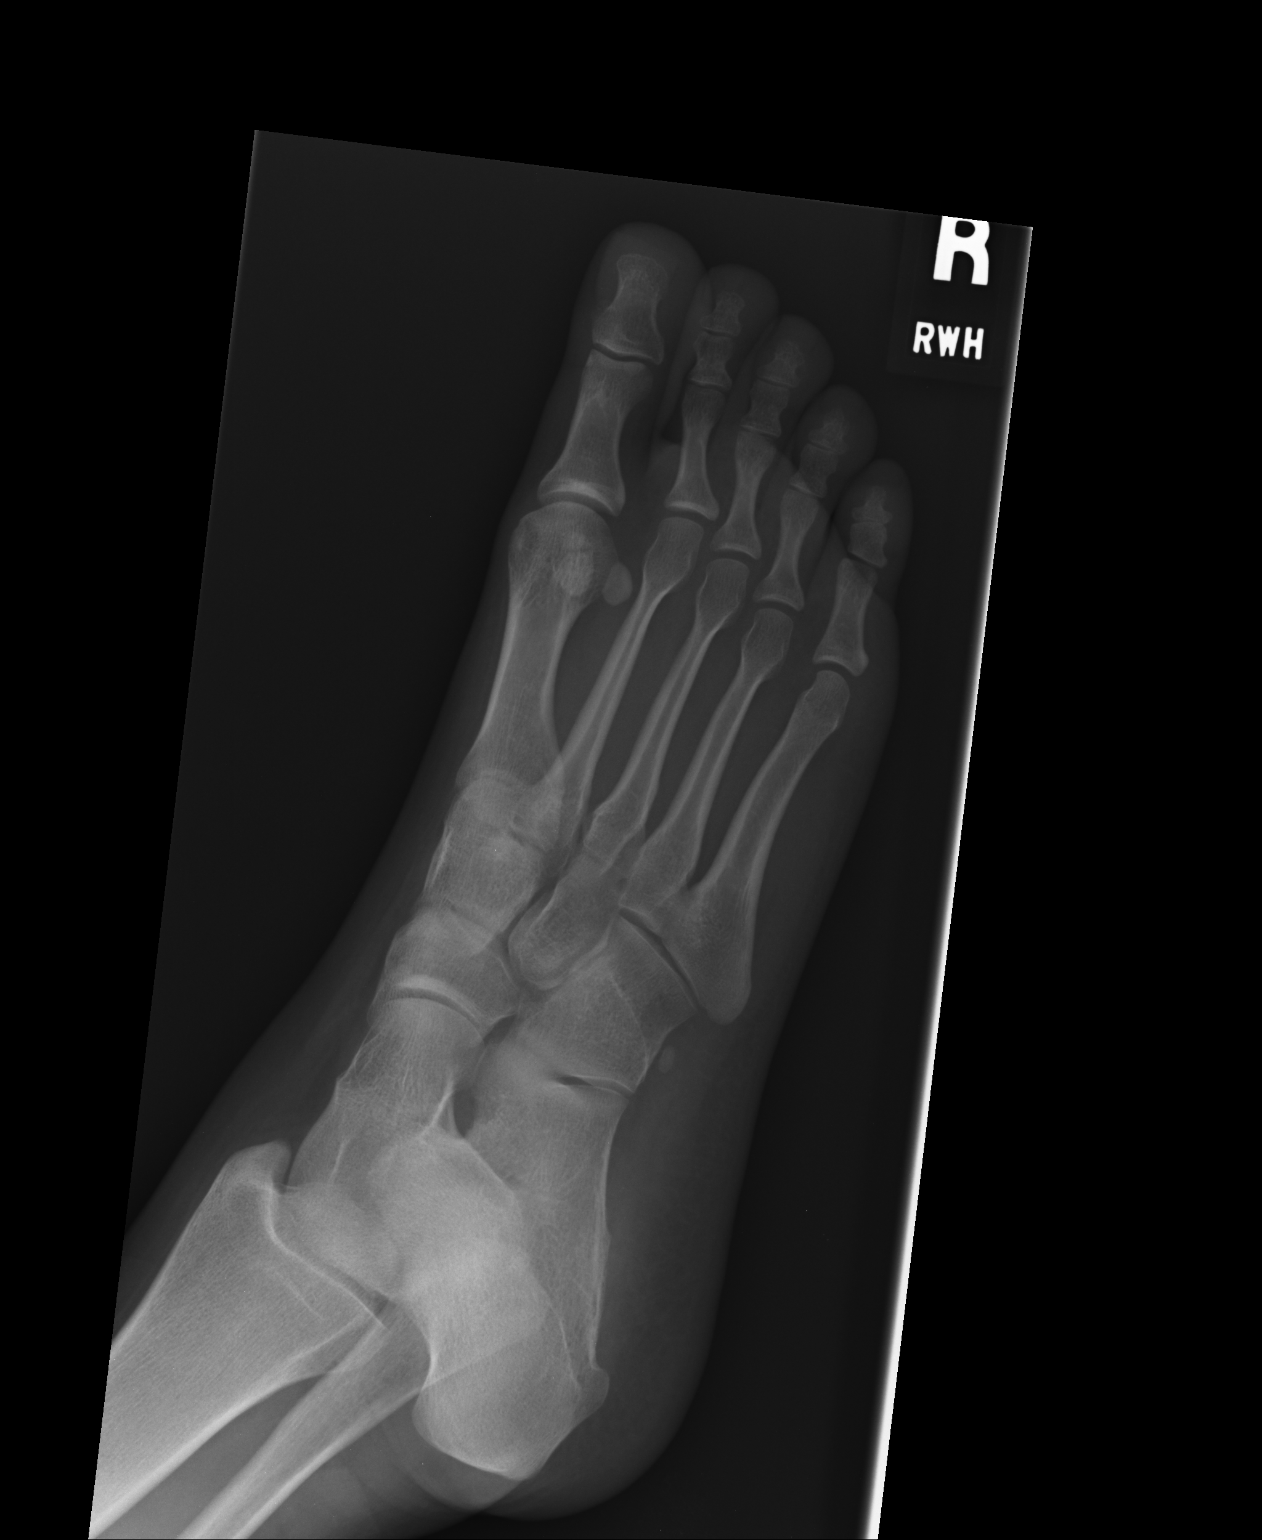

[3 of 3 positions shown; findings below may reference images not displayed]

FINDINGS: No fracture or dislocation.  Incidental note is made of
an os peroneus.  Joint spaces are preserved.  No erosions.
Regional soft tissues are normal.  No radiopaque foreign body.
IMPRESSION: Normal radiographs of the right foot.

## 2013-12-28 ENCOUNTER — Encounter: Payer: Self-pay | Admitting: Family Medicine

## 2013-12-28 ENCOUNTER — Ambulatory Visit (INDEPENDENT_AMBULATORY_CARE_PROVIDER_SITE_OTHER): Payer: BC Managed Care – PPO | Admitting: Family Medicine

## 2013-12-28 VITALS — BP 128/88 | HR 96 | Temp 98.5°F | Resp 16 | Ht 63.0 in | Wt 127.0 lb

## 2013-12-28 DIAGNOSIS — M79604 Pain in right leg: Secondary | ICD-10-CM

## 2013-12-28 NOTE — Progress Notes (Signed)
   Subjective:    Patient ID: Barbara Hendricks, female    DOB: [birth year], 23 y.o.   MRN: [id number]  HPI Patient presents today with a several week history of pain behind her right leg. It is worse with driving. No falls. Her boyfriend's dog jumped on her leg prior to the pain starting which caused bruising. Is having pain most days. Does not interfere with sleep. Does not bother her if she is a passenger in a vehicle, only when she drives for more than 30 minutes. Pain slightly above and below knee on lateral aspect. Has not tried any medication. Her main concern is that she is going out of town in a couple of days and may need to do some driving. Has not changed shoes recently. Little pain with everyday activities. Not getting worse.   No personal or family history of clotting disorders.   Past Medical History  Diagnosis Date  . Generalized headaches   . Breast mass, right    Past Surgical History  Procedure Laterality Date  . Wisdom tooth extraction  01/2008   Family History  Problem Relation Age of Onset  . Cancer Maternal Grandmother     colon  . Cancer Paternal Grandmother     coloni   History  Substance Use Topics  . Smoking status: Never Smoker   . Smokeless tobacco: Never Used  . Alcohol Use: Yes     Comment: 1 - 2 per month   Review of Systems No CP, occasional SOB with stress, none recently.     Objective:   Physical Exam  Constitutional: She is oriented to person, place, and time. She appears well-developed and well-nourished.  HENT:  Head: Normocephalic and atraumatic.  Eyes: Conjunctivae are normal.  Cardiovascular: Normal rate, regular rhythm and normal heart sounds.   Pulmonary/Chest: Effort normal and breath sounds normal.  Musculoskeletal: Normal range of motion. She exhibits no edema or tenderness.  Right leg without erythema, tenderness. Full ROM. Calf without areas of swelling, tenderness or erythema.  Neurological: She is alert and oriented to person,  place, and time.  Skin: Skin is warm and dry.  Psychiatric: She has a normal mood and affect. Her behavior is normal. Judgment and thought content normal.  Vitals reviewed. BP 128/88 mmHg  Pulse 96  Temp(Src) 98.5 F (36.9 C) (Oral)  Resp 16  Ht 5\' 3"  (1.6 m)  Wt 127 lb (57.607 kg)  BMI 22.50 kg/m2  SpO2 100%  LMP 12/12/2013    Assessment & Plan:  1. Right leg pain - seems musculoskeletal in nature, ? Related to injury from dog jumping on her  -discussed conservative treatment and will follow up if no improvement with OTC NSAID trial. Patient Instructions  Ibuprofen 2-3 tablets twice a day for at least 5 days, can also use moist heat.  Seek care immediately if you have redness, swelling, warmth or significant increase in pain.      Emi Belfasteborah B. Gessner, FNP-BC  [facility], [facility]  12/30/2013 3:24 PM

## 2013-12-28 NOTE — Patient Instructions (Addendum)
Ibuprofen 2-3 tablets twice a day for at least 5 days, can also use moist heat.  Seek care immediately if you have redness, swelling, warmth or significant increase in pain.

## 2014-05-20 ENCOUNTER — Ambulatory Visit (INDEPENDENT_AMBULATORY_CARE_PROVIDER_SITE_OTHER): Payer: BC Managed Care – PPO | Admitting: Family Medicine

## 2014-05-20 VITALS — BP 124/88 | HR 83 | Temp 98.9°F | Resp 14 | Ht 63.0 in | Wt 136.2 lb

## 2014-05-20 DIAGNOSIS — M79672 Pain in left foot: Secondary | ICD-10-CM

## 2014-05-20 NOTE — Patient Instructions (Signed)
Wear supportive lace up shoes, use OTC anti-inflammatories and apply ice to your foot and ankle.   Let us know if your foot is not improved over the next few days- if you would like to do an ultrasound please let us know.

## 2014-05-20 NOTE — Progress Notes (Signed)
[facility] [address], [place] [address] [phone]  Date:  05/20/2014   Name:  Barbara Reichertameka L Narang   DOB:  1990/11/25   MRN:  [id number]  PCP:  No primary care provider on file.    Chief Complaint: Foot pain   History of Present Illness:  Barbara Hendricks is a 24 y.o. very pleasant female patient who presents with the following:  Generally healthy lady here with a left foot complaint.  Today is Friday.  On Monday she noted swelling of her left foot.   Also on Monday she had some pain in her left knee.  On Monday the foot started to  When she wakes up in the am her foot is better- less swollen and painful She applied ice last night.   She also wore sneakers today which she feels made her feel better.   No history of blood clot.  She is a non-smoker, no OCP, no recent immobilization  LMP was 4/19  Patient Active Problem List   Diagnosis Date Noted  . Anemia 03/21/2013  . Hemorrhoids 02/14/2013  . Breast mass in female 04/15/2011    Past Medical History  Diagnosis Date  . Generalized headaches   . Breast mass, right     Past Surgical History  Procedure Laterality Date  . Wisdom tooth extraction  01/2008    History  Substance Use Topics  . Smoking status: Never Smoker   . Smokeless tobacco: Never Used  . Alcohol Use: Yes     Comment: 1 - 2 per month    Family History  Problem Relation Age of Onset  . Cancer Maternal Grandmother     colon  . Cancer Paternal Grandmother     coloni    Allergies  Allergen Reactions  . Food Other (See Comments)    Strawberries=hives     Medication list has been reviewed and updated.  Current Outpatient Prescriptions on File Prior to Visit  Medication Sig Dispense Refill  . multivitamin-iron-minerals-folic acid (CENTRUM) chewable tablet Chew 1 tablet by mouth daily. 30 tablet 5  . fluticasone (FLONASE) 50 MCG/ACT nasal spray Place 2 sprays into the nose daily. 16 g 6  . hydrocortisone (ANUSOL-HC)  25 MG suppository Place 1 suppository (25 mg total) rectally 2 (two) times daily. Use as needed for hemorrhoid inflammation. (Patient not taking: Reported on 12/28/2013) 12 suppository 0   No current facility-administered medications on file prior to visit.    Review of Systems:  As per HPI- otherwise negative.   Physical Examination: Filed Vitals:   05/20/14 1820  BP: 124/88  Pulse: 83  Temp: 98.9 F (37.2 C)  Resp: 14   Filed Vitals:   05/20/14 1820  Height: 5\' 3"  (1.6 m)  Weight: 136 lb 3.2 oz (61.78 kg)   Body mass index is 24.13 kg/(m^2). Ideal Body Weight: Weight in (lb) to have BMI = 25: 140.8  GEN: WDWN, NAD, Non-toxic, A & O x 3, looks well HEENT: Atraumatic, Normocephalic. Neck supple. No masses, No LAD. Ears and Nose: No external deformity. CV: RRR, No M/G/R. No JVD. No thrill. No extra heart sounds. PULM: CTA B, no wheezes, crackles, rhonchi. No retractions. No resp. distress. No accessory muscle use. ABD: S, NT, ND, +BS. No rebound. No HSM. EXTR: No c/c/e NEURO Normal gait.  PSYCH: Normally interactive. Conversant. Not depressed or anxious appearing.  Calm demeanor.  She has maybe trace edema of the left foot/ ankle, but no obvious  difference between the right and left.  No tenderness in the left knee or calf.  She is tender over the dorsal left foot, likely involving the extensor hallucis longus tendon Normal cap refill and DP pulse of both feet   Assessment and Plan: Pain in left foot  Discussed with pt.  I think this is likely due to tendonitis. However I am glad to schedule an US for tomorrow to absolutely rule- out a blood clot.  At this time she declines US and will try conservative management.   She will let us know if not continuing to get better.  She states that she is better after wearing her tennis shoes today instead of sandals   Signed Abbe AmsterdamJessica Copland, MD
